# Patient Record
Sex: Female | Born: 1950 | Hispanic: No | Smoking: Never smoker
Health system: Southern US, Community
[De-identification: ages and names within clinical notes are randomized; demographics above are authoritative.]

## PROBLEM LIST (undated history)

## (undated) DIAGNOSIS — D649 Anemia, unspecified: Secondary | ICD-10-CM

## (undated) DIAGNOSIS — M1990 Unspecified osteoarthritis, unspecified site: Secondary | ICD-10-CM

---

## 1998-05-27 ENCOUNTER — Other Ambulatory Visit: Admission: RE | Admit: 1998-05-27 | Discharge: 1998-05-27 | Payer: Self-pay | Admitting: Obstetrics and Gynecology

## 1998-07-04 ENCOUNTER — Other Ambulatory Visit: Admission: RE | Admit: 1998-07-04 | Discharge: 1998-07-04 | Payer: Self-pay | Admitting: Obstetrics and Gynecology

## 1999-07-21 ENCOUNTER — Other Ambulatory Visit: Admission: RE | Admit: 1999-07-21 | Discharge: 1999-07-21 | Payer: Self-pay | Admitting: *Deleted

## 2000-07-29 ENCOUNTER — Other Ambulatory Visit: Admission: RE | Admit: 2000-07-29 | Discharge: 2000-07-29 | Payer: Self-pay | Admitting: Obstetrics and Gynecology

## 2001-08-04 ENCOUNTER — Other Ambulatory Visit: Admission: RE | Admit: 2001-08-04 | Discharge: 2001-08-04 | Payer: Self-pay | Admitting: Obstetrics and Gynecology

## 2002-08-25 ENCOUNTER — Other Ambulatory Visit: Admission: RE | Admit: 2002-08-25 | Discharge: 2002-08-25 | Payer: Self-pay | Admitting: Obstetrics and Gynecology

## 2003-10-26 ENCOUNTER — Other Ambulatory Visit: Admission: RE | Admit: 2003-10-26 | Discharge: 2003-10-26 | Payer: Self-pay | Admitting: Obstetrics and Gynecology

## 2017-02-13 DIAGNOSIS — L821 Other seborrheic keratosis: Secondary | ICD-10-CM | POA: Diagnosis not present

## 2017-02-13 DIAGNOSIS — L814 Other melanin hyperpigmentation: Secondary | ICD-10-CM | POA: Diagnosis not present

## 2017-02-13 DIAGNOSIS — L720 Epidermal cyst: Secondary | ICD-10-CM | POA: Diagnosis not present

## 2017-02-13 DIAGNOSIS — L579 Skin changes due to chronic exposure to nonionizing radiation, unspecified: Secondary | ICD-10-CM | POA: Diagnosis not present

## 2017-04-24 DIAGNOSIS — L720 Epidermal cyst: Secondary | ICD-10-CM | POA: Diagnosis not present

## 2017-04-29 DIAGNOSIS — D492 Neoplasm of unspecified behavior of bone, soft tissue, and skin: Secondary | ICD-10-CM | POA: Diagnosis not present

## 2017-04-29 DIAGNOSIS — L720 Epidermal cyst: Secondary | ICD-10-CM | POA: Diagnosis not present

## 2017-09-06 DIAGNOSIS — H5203 Hypermetropia, bilateral: Secondary | ICD-10-CM | POA: Diagnosis not present

## 2017-09-06 DIAGNOSIS — H52203 Unspecified astigmatism, bilateral: Secondary | ICD-10-CM | POA: Diagnosis not present

## 2017-09-06 DIAGNOSIS — H2513 Age-related nuclear cataract, bilateral: Secondary | ICD-10-CM | POA: Diagnosis not present

## 2017-09-06 DIAGNOSIS — H35363 Drusen (degenerative) of macula, bilateral: Secondary | ICD-10-CM | POA: Diagnosis not present

## 2017-10-02 DIAGNOSIS — B373 Candidiasis of vulva and vagina: Secondary | ICD-10-CM | POA: Diagnosis not present

## 2017-10-02 DIAGNOSIS — F5101 Primary insomnia: Secondary | ICD-10-CM | POA: Diagnosis not present

## 2017-10-02 DIAGNOSIS — Z79899 Other long term (current) drug therapy: Secondary | ICD-10-CM | POA: Diagnosis not present

## 2017-10-02 DIAGNOSIS — M8580 Other specified disorders of bone density and structure, unspecified site: Secondary | ICD-10-CM | POA: Diagnosis not present

## 2017-10-02 DIAGNOSIS — Z0000 Encounter for general adult medical examination without abnormal findings: Secondary | ICD-10-CM | POA: Diagnosis not present

## 2017-10-02 DIAGNOSIS — Z23 Encounter for immunization: Secondary | ICD-10-CM | POA: Diagnosis not present

## 2017-10-22 DIAGNOSIS — M545 Low back pain: Secondary | ICD-10-CM | POA: Diagnosis not present

## 2017-10-28 DIAGNOSIS — M25552 Pain in left hip: Secondary | ICD-10-CM | POA: Diagnosis not present

## 2017-10-28 DIAGNOSIS — M179 Osteoarthritis of knee, unspecified: Secondary | ICD-10-CM | POA: Diagnosis not present

## 2017-10-28 DIAGNOSIS — M1612 Unilateral primary osteoarthritis, left hip: Secondary | ICD-10-CM | POA: Diagnosis not present

## 2017-10-28 DIAGNOSIS — M79605 Pain in left leg: Secondary | ICD-10-CM | POA: Diagnosis not present

## 2018-01-14 DIAGNOSIS — M79605 Pain in left leg: Secondary | ICD-10-CM | POA: Diagnosis not present

## 2018-01-14 DIAGNOSIS — M5136 Other intervertebral disc degeneration, lumbar region: Secondary | ICD-10-CM | POA: Diagnosis not present

## 2018-01-14 DIAGNOSIS — S73192A Other sprain of left hip, initial encounter: Secondary | ICD-10-CM | POA: Diagnosis not present

## 2018-01-14 DIAGNOSIS — M47816 Spondylosis without myelopathy or radiculopathy, lumbar region: Secondary | ICD-10-CM | POA: Diagnosis not present

## 2018-01-14 DIAGNOSIS — M160 Bilateral primary osteoarthritis of hip: Secondary | ICD-10-CM | POA: Diagnosis not present

## 2018-01-14 DIAGNOSIS — S32010S Wedge compression fracture of first lumbar vertebra, sequela: Secondary | ICD-10-CM | POA: Diagnosis not present

## 2018-01-26 DIAGNOSIS — S32010S Wedge compression fracture of first lumbar vertebra, sequela: Secondary | ICD-10-CM | POA: Diagnosis not present

## 2018-01-26 DIAGNOSIS — M25552 Pain in left hip: Secondary | ICD-10-CM | POA: Diagnosis not present

## 2018-01-26 DIAGNOSIS — S73192A Other sprain of left hip, initial encounter: Secondary | ICD-10-CM | POA: Diagnosis not present

## 2018-01-26 DIAGNOSIS — M79605 Pain in left leg: Secondary | ICD-10-CM | POA: Diagnosis not present

## 2018-01-26 DIAGNOSIS — M545 Low back pain: Secondary | ICD-10-CM | POA: Diagnosis not present

## 2018-01-26 DIAGNOSIS — M5136 Other intervertebral disc degeneration, lumbar region: Secondary | ICD-10-CM | POA: Diagnosis not present

## 2018-01-28 DIAGNOSIS — S32010S Wedge compression fracture of first lumbar vertebra, sequela: Secondary | ICD-10-CM | POA: Diagnosis not present

## 2018-03-25 DIAGNOSIS — M4850XD Collapsed vertebra, not elsewhere classified, site unspecified, subsequent encounter for fracture with routine healing: Secondary | ICD-10-CM | POA: Diagnosis not present

## 2018-03-25 DIAGNOSIS — M545 Low back pain: Secondary | ICD-10-CM | POA: Diagnosis not present

## 2018-03-25 DIAGNOSIS — M2560 Stiffness of unspecified joint, not elsewhere classified: Secondary | ICD-10-CM | POA: Diagnosis not present

## 2018-04-09 DIAGNOSIS — M545 Low back pain: Secondary | ICD-10-CM | POA: Diagnosis not present

## 2018-04-16 DIAGNOSIS — M2560 Stiffness of unspecified joint, not elsewhere classified: Secondary | ICD-10-CM | POA: Diagnosis not present

## 2018-04-16 DIAGNOSIS — M545 Low back pain: Secondary | ICD-10-CM | POA: Diagnosis not present

## 2018-04-18 DIAGNOSIS — M4850XD Collapsed vertebra, not elsewhere classified, site unspecified, subsequent encounter for fracture with routine healing: Secondary | ICD-10-CM | POA: Diagnosis not present

## 2018-04-22 DIAGNOSIS — M545 Low back pain: Secondary | ICD-10-CM | POA: Diagnosis not present

## 2018-04-22 DIAGNOSIS — M2560 Stiffness of unspecified joint, not elsewhere classified: Secondary | ICD-10-CM | POA: Diagnosis not present

## 2018-04-25 DIAGNOSIS — M4850XD Collapsed vertebra, not elsewhere classified, site unspecified, subsequent encounter for fracture with routine healing: Secondary | ICD-10-CM | POA: Diagnosis not present

## 2018-04-29 DIAGNOSIS — M4856XD Collapsed vertebra, not elsewhere classified, lumbar region, subsequent encounter for fracture with routine healing: Secondary | ICD-10-CM | POA: Diagnosis not present

## 2018-05-02 DIAGNOSIS — M545 Low back pain: Secondary | ICD-10-CM | POA: Diagnosis not present

## 2018-05-05 DIAGNOSIS — M4850XA Collapsed vertebra, not elsewhere classified, site unspecified, initial encounter for fracture: Secondary | ICD-10-CM | POA: Diagnosis not present

## 2018-05-05 DIAGNOSIS — M2560 Stiffness of unspecified joint, not elsewhere classified: Secondary | ICD-10-CM | POA: Diagnosis not present

## 2018-05-05 DIAGNOSIS — S73192D Other sprain of left hip, subsequent encounter: Secondary | ICD-10-CM | POA: Diagnosis not present

## 2018-08-08 DIAGNOSIS — M7062 Trochanteric bursitis, left hip: Secondary | ICD-10-CM | POA: Diagnosis not present

## 2018-09-20 DIAGNOSIS — R05 Cough: Secondary | ICD-10-CM | POA: Diagnosis not present

## 2018-09-20 DIAGNOSIS — J018 Other acute sinusitis: Secondary | ICD-10-CM | POA: Diagnosis not present

## 2018-09-26 DIAGNOSIS — H35363 Drusen (degenerative) of macula, bilateral: Secondary | ICD-10-CM | POA: Diagnosis not present

## 2018-09-26 DIAGNOSIS — H04123 Dry eye syndrome of bilateral lacrimal glands: Secondary | ICD-10-CM | POA: Diagnosis not present

## 2018-09-26 DIAGNOSIS — D3142 Benign neoplasm of left ciliary body: Secondary | ICD-10-CM | POA: Diagnosis not present

## 2018-09-26 DIAGNOSIS — H52203 Unspecified astigmatism, bilateral: Secondary | ICD-10-CM | POA: Diagnosis not present

## 2018-09-26 DIAGNOSIS — H5203 Hypermetropia, bilateral: Secondary | ICD-10-CM | POA: Diagnosis not present

## 2018-09-26 DIAGNOSIS — D3141 Benign neoplasm of right ciliary body: Secondary | ICD-10-CM | POA: Diagnosis not present

## 2018-09-26 DIAGNOSIS — H524 Presbyopia: Secondary | ICD-10-CM | POA: Diagnosis not present

## 2018-09-26 DIAGNOSIS — H2513 Age-related nuclear cataract, bilateral: Secondary | ICD-10-CM | POA: Diagnosis not present

## 2018-10-07 DIAGNOSIS — M810 Age-related osteoporosis without current pathological fracture: Secondary | ICD-10-CM | POA: Diagnosis not present

## 2018-10-07 DIAGNOSIS — M549 Dorsalgia, unspecified: Secondary | ICD-10-CM | POA: Diagnosis not present

## 2018-10-07 DIAGNOSIS — I444 Left anterior fascicular block: Secondary | ICD-10-CM | POA: Diagnosis not present

## 2018-10-07 DIAGNOSIS — B373 Candidiasis of vulva and vagina: Secondary | ICD-10-CM | POA: Diagnosis not present

## 2018-10-07 DIAGNOSIS — Z0000 Encounter for general adult medical examination without abnormal findings: Secondary | ICD-10-CM | POA: Diagnosis not present

## 2018-10-07 DIAGNOSIS — R05 Cough: Secondary | ICD-10-CM | POA: Diagnosis not present

## 2018-10-07 DIAGNOSIS — I959 Hypotension, unspecified: Secondary | ICD-10-CM | POA: Diagnosis not present

## 2018-10-07 DIAGNOSIS — F5101 Primary insomnia: Secondary | ICD-10-CM | POA: Diagnosis not present

## 2018-10-15 DIAGNOSIS — M545 Low back pain: Secondary | ICD-10-CM | POA: Diagnosis not present

## 2018-10-17 DIAGNOSIS — I959 Hypotension, unspecified: Secondary | ICD-10-CM | POA: Diagnosis not present

## 2018-10-17 DIAGNOSIS — R8290 Unspecified abnormal findings in urine: Secondary | ICD-10-CM | POA: Diagnosis not present

## 2018-10-17 DIAGNOSIS — R05 Cough: Secondary | ICD-10-CM | POA: Diagnosis not present

## 2018-10-17 DIAGNOSIS — Z23 Encounter for immunization: Secondary | ICD-10-CM | POA: Diagnosis not present

## 2019-01-21 DIAGNOSIS — M7062 Trochanteric bursitis, left hip: Secondary | ICD-10-CM | POA: Diagnosis not present

## 2019-02-18 DIAGNOSIS — M546 Pain in thoracic spine: Secondary | ICD-10-CM | POA: Diagnosis not present

## 2019-03-01 DIAGNOSIS — M545 Low back pain: Secondary | ICD-10-CM | POA: Diagnosis not present

## 2019-03-01 DIAGNOSIS — S22050A Wedge compression fracture of T5-T6 vertebra, initial encounter for closed fracture: Secondary | ICD-10-CM | POA: Diagnosis not present

## 2019-03-01 DIAGNOSIS — S22030A Wedge compression fracture of third thoracic vertebra, initial encounter for closed fracture: Secondary | ICD-10-CM | POA: Diagnosis not present

## 2019-03-01 DIAGNOSIS — S32010A Wedge compression fracture of first lumbar vertebra, initial encounter for closed fracture: Secondary | ICD-10-CM | POA: Diagnosis not present

## 2019-03-01 DIAGNOSIS — S22080A Wedge compression fracture of T11-T12 vertebra, initial encounter for closed fracture: Secondary | ICD-10-CM | POA: Diagnosis not present

## 2019-03-01 DIAGNOSIS — S32020A Wedge compression fracture of second lumbar vertebra, initial encounter for closed fracture: Secondary | ICD-10-CM | POA: Diagnosis not present

## 2019-03-01 DIAGNOSIS — M546 Pain in thoracic spine: Secondary | ICD-10-CM | POA: Diagnosis not present

## 2019-03-04 DIAGNOSIS — M545 Low back pain: Secondary | ICD-10-CM | POA: Diagnosis not present

## 2019-03-04 DIAGNOSIS — M546 Pain in thoracic spine: Secondary | ICD-10-CM | POA: Diagnosis not present

## 2019-03-06 DIAGNOSIS — M810 Age-related osteoporosis without current pathological fracture: Secondary | ICD-10-CM | POA: Diagnosis not present

## 2019-08-17 DIAGNOSIS — M4696 Unspecified inflammatory spondylopathy, lumbar region: Secondary | ICD-10-CM | POA: Diagnosis not present

## 2019-08-17 DIAGNOSIS — M545 Low back pain: Secondary | ICD-10-CM | POA: Diagnosis not present

## 2019-08-21 DIAGNOSIS — M47816 Spondylosis without myelopathy or radiculopathy, lumbar region: Secondary | ICD-10-CM | POA: Diagnosis not present

## 2019-09-08 DIAGNOSIS — M47816 Spondylosis without myelopathy or radiculopathy, lumbar region: Secondary | ICD-10-CM | POA: Diagnosis not present

## 2019-09-30 DIAGNOSIS — M47816 Spondylosis without myelopathy or radiculopathy, lumbar region: Secondary | ICD-10-CM | POA: Diagnosis not present

## 2019-10-09 DIAGNOSIS — H2513 Age-related nuclear cataract, bilateral: Secondary | ICD-10-CM | POA: Diagnosis not present

## 2019-10-09 DIAGNOSIS — H52203 Unspecified astigmatism, bilateral: Secondary | ICD-10-CM | POA: Diagnosis not present

## 2019-10-09 DIAGNOSIS — D3142 Benign neoplasm of left ciliary body: Secondary | ICD-10-CM | POA: Diagnosis not present

## 2019-10-09 DIAGNOSIS — H5203 Hypermetropia, bilateral: Secondary | ICD-10-CM | POA: Diagnosis not present

## 2019-10-09 DIAGNOSIS — H04123 Dry eye syndrome of bilateral lacrimal glands: Secondary | ICD-10-CM | POA: Diagnosis not present

## 2019-10-09 DIAGNOSIS — H524 Presbyopia: Secondary | ICD-10-CM | POA: Diagnosis not present

## 2019-10-09 DIAGNOSIS — D3141 Benign neoplasm of right ciliary body: Secondary | ICD-10-CM | POA: Diagnosis not present

## 2019-10-09 DIAGNOSIS — H35363 Drusen (degenerative) of macula, bilateral: Secondary | ICD-10-CM | POA: Diagnosis not present

## 2019-10-14 DIAGNOSIS — M159 Polyosteoarthritis, unspecified: Secondary | ICD-10-CM | POA: Diagnosis not present

## 2019-10-14 DIAGNOSIS — R8290 Unspecified abnormal findings in urine: Secondary | ICD-10-CM | POA: Diagnosis not present

## 2019-10-14 DIAGNOSIS — Z79899 Other long term (current) drug therapy: Secondary | ICD-10-CM | POA: Diagnosis not present

## 2019-10-14 DIAGNOSIS — F5101 Primary insomnia: Secondary | ICD-10-CM | POA: Diagnosis not present

## 2019-10-14 DIAGNOSIS — M810 Age-related osteoporosis without current pathological fracture: Secondary | ICD-10-CM | POA: Diagnosis not present

## 2019-10-14 DIAGNOSIS — Z0000 Encounter for general adult medical examination without abnormal findings: Secondary | ICD-10-CM | POA: Diagnosis not present

## 2019-10-23 DIAGNOSIS — M546 Pain in thoracic spine: Secondary | ICD-10-CM | POA: Diagnosis not present

## 2019-11-02 DIAGNOSIS — M546 Pain in thoracic spine: Secondary | ICD-10-CM | POA: Diagnosis not present

## 2019-11-06 ENCOUNTER — Other Ambulatory Visit: Payer: Self-pay | Admitting: Orthopedic Surgery

## 2019-11-06 DIAGNOSIS — M545 Low back pain: Secondary | ICD-10-CM | POA: Diagnosis not present

## 2019-11-09 NOTE — Progress Notes (Signed)
[id number] - [place], [place] - [address], [address] AT [facility] [address] [place], [address] [place] [address] Phone: [phone] Fax: [phone]      Your procedure is scheduled on Thursday, November 12, 2019.  Report to [facility] Main Entrance "A" at 10:00 A.M., and check in at the Admitting office.  Call this number if you have problems the morning of surgery:  [phone]  Call [phone] if you have any questions prior to your surgery date Monday-Friday 8am-4pm    Remember:  Do not eat after midnight the night before your surgery  You may drink clear liquids until 9:00 AM the morning of your surgery.   Clear liquids allowed are: Water, Non-Citrus Juices (without pulp), Carbonated Beverages, Clear Tea, Black Coffee Only, and Gatorade  Please complete your PRE-SURGERY ENSURE that was provided to you by 9:00 AM the morning of surgery.  Please, if able, drink it in one setting. DO NOT SIP.     Take these medicines the morning of surgery with A SIP OF WATER: traMADol (ULTRAM)  7 days prior to surgery STOP taking any Aspirin (unless otherwise instructed by your surgeon), Aleve, Naproxen, Ibuprofen, Motrin, Advil, Goody's, BC's, all herbal medications, fish oil, and all vitamins.    The Morning of Surgery  Do not wear jewelry, make-up or nail polish.  Do not wear lotions, powders, perfumes, or deodorant  Do not shave 48 hours prior to surgery.  Do not bring valuables to the hospital.  [facility] is not responsible for any belongings or valuables.  If you are a smoker, DO NOT Smoke 24 hours prior to surgery  If you wear a CPAP at night please bring your mask the morning of surgery   Remember that you must have someone to transport you home after your surgery, and remain with you for 24 hours if you are discharged the same day.   Please bring cases for contacts, glasses, hearing aids, dentures or bridgework because it  cannot be worn into surgery.    Leave your suitcase in the car.  After surgery it may be brought to your room.  For patients admitted to the hospital, discharge time will be determined by your treatment team.  Patients discharged the day of surgery will not be allowed to drive home.    Special instructions:   [facility]- Preparing For Surgery  Before surgery, you can play an important role. Because skin is not sterile, your skin needs to be as free of germs as possible. You can reduce the number of germs on your skin by washing with CHG (chlorahexidine gluconate) Soap before surgery.  CHG is an antiseptic cleaner which kills germs and bonds with the skin to continue killing germs even after washing.    Oral Hygiene is also important to reduce your risk of infection.  Remember - BRUSH YOUR TEETH THE MORNING OF SURGERY WITH YOUR REGULAR TOOTHPASTE  Please do not use if you have an allergy to CHG or antibacterial soaps. If your skin becomes reddened/irritated stop using the CHG.  Do not shave (including legs and underarms) for at least 48 hours prior to first CHG shower. It is OK to shave your face.  Please follow these instructions carefully.   1. Shower the NIGHT BEFORE SURGERY and the MORNING OF SURGERY with CHG Soap.   2. If you chose to wash your hair, wash your hair first as usual with your normal shampoo.  3.  After you shampoo, rinse your hair and body thoroughly to remove the shampoo.  4. Use CHG as you would any other liquid soap. You can apply CHG directly to the skin and wash gently with a scrungie or a clean washcloth.   5. Apply the CHG Soap to your body ONLY FROM THE NECK DOWN.  Do not use on open wounds or open sores. Avoid contact with your eyes, ears, mouth and genitals (private parts). Wash Face and genitals (private parts)  with your normal soap.   6. Wash thoroughly, paying special attention to the area where your surgery will be performed.  7. Thoroughly rinse  your body with warm water from the neck down.  8. DO NOT shower/wash with your normal soap after using and rinsing off the CHG Soap.  9. Pat yourself dry with a CLEAN TOWEL.  10. Wear CLEAN PAJAMAS to bed the night before surgery, wear comfortable clothes the morning of surgery  11. Place CLEAN SHEETS on your bed the night of your first shower and DO NOT SLEEP WITH PETS.    Day of Surgery:  Please shower the morning of surgery with the CHG soap Do not apply any deodorants/lotions. Please wear clean clothes to the hospital/surgery center.   Remember to brush your teeth WITH YOUR REGULAR TOOTHPASTE.   Please read over the following fact sheets that you were given.

## 2019-11-10 ENCOUNTER — Other Ambulatory Visit: Payer: Self-pay

## 2019-11-10 ENCOUNTER — Other Ambulatory Visit (HOSPITAL_COMMUNITY)
Admission: RE | Admit: 2019-11-10 | Discharge: 2019-11-10 | Disposition: A | Discharge status: Home / Self Care | Payer: PPO | Source: Ambulatory Visit | Attending: Orthopedic Surgery | Admitting: Orthopedic Surgery

## 2019-11-10 ENCOUNTER — Encounter (HOSPITAL_COMMUNITY)
Admission: RE | Admit: 2019-11-10 | Discharge: 2019-11-10 | Disposition: A | Discharge status: Home / Self Care | Payer: PPO | Source: Ambulatory Visit | Attending: Orthopedic Surgery | Admitting: Orthopedic Surgery

## 2019-11-10 ENCOUNTER — Encounter (HOSPITAL_COMMUNITY): Payer: Self-pay

## 2019-11-10 DIAGNOSIS — Z79899 Other long term (current) drug therapy: Secondary | ICD-10-CM | POA: Diagnosis not present

## 2019-11-10 DIAGNOSIS — Z01810 Encounter for preprocedural cardiovascular examination: Secondary | ICD-10-CM | POA: Diagnosis not present

## 2019-11-10 DIAGNOSIS — M1990 Unspecified osteoarthritis, unspecified site: Secondary | ICD-10-CM | POA: Diagnosis not present

## 2019-11-10 DIAGNOSIS — M4854XA Collapsed vertebra, not elsewhere classified, thoracic region, initial encounter for fracture: Secondary | ICD-10-CM | POA: Diagnosis not present

## 2019-11-10 DIAGNOSIS — Z01818 Encounter for other preprocedural examination: Secondary | ICD-10-CM | POA: Insufficient documentation

## 2019-11-10 DIAGNOSIS — Z20822 Contact with and (suspected) exposure to covid-19: Secondary | ICD-10-CM | POA: Diagnosis not present

## 2019-11-10 DIAGNOSIS — D649 Anemia, unspecified: Secondary | ICD-10-CM | POA: Insufficient documentation

## 2019-11-10 DIAGNOSIS — Z01812 Encounter for preprocedural laboratory examination: Secondary | ICD-10-CM | POA: Diagnosis not present

## 2019-11-10 HISTORY — DX: Unspecified osteoarthritis, unspecified site: M19.90

## 2019-11-10 HISTORY — DX: Anemia, unspecified: D64.9

## 2019-11-10 LAB — CBC WITH DIFFERENTIAL/PLATELET
Abs Immature Granulocytes: 0.03 10*3/uL (ref 0.00–0.07)
Basophils Absolute: 0 10*3/uL (ref 0.0–0.1)
Basophils Relative: 0 %
Eosinophils Absolute: 0 10*3/uL (ref 0.0–0.5)
Eosinophils Relative: 0 %
HCT: 38 % (ref 36.0–46.0)
Hemoglobin: 12.3 g/dL (ref 12.0–15.0)
Immature Granulocytes: 1 %
Lymphocytes Relative: 33 %
Lymphs Abs: 1.5 10*3/uL (ref 0.7–4.0)
MCH: 33.3 pg (ref 26.0–34.0)
MCHC: 32.4 g/dL (ref 30.0–36.0)
MCV: 103 fL — ABNORMAL HIGH (ref 80.0–100.0)
Monocytes Absolute: 0.4 10*3/uL (ref 0.1–1.0)
Monocytes Relative: 9 %
Neutro Abs: 2.6 10*3/uL (ref 1.7–7.7)
Neutrophils Relative %: 57 %
Platelets: 266 10*3/uL (ref 150–400)
RBC: 3.69 MIL/uL — ABNORMAL LOW (ref 3.87–5.11)
RDW: 14 % (ref 11.5–15.5)
WBC: 4.6 10*3/uL (ref 4.0–10.5)
nRBC: 0 % (ref 0.0–0.2)

## 2019-11-10 LAB — URINALYSIS, ROUTINE W REFLEX MICROSCOPIC
Bilirubin Urine: NEGATIVE
Glucose, UA: NEGATIVE mg/dL
Hgb urine dipstick: NEGATIVE
Ketones, ur: NEGATIVE mg/dL
Leukocytes,Ua: NEGATIVE
Nitrite: NEGATIVE
Protein, ur: NEGATIVE mg/dL
Specific Gravity, Urine: 1.021 (ref 1.005–1.030)
pH: 6 (ref 5.0–8.0)

## 2019-11-10 LAB — COMPREHENSIVE METABOLIC PANEL
ALT: 17 U/L (ref 0–44)
AST: 23 U/L (ref 15–41)
Albumin: 4.2 g/dL (ref 3.5–5.0)
Alkaline Phosphatase: 82 U/L (ref 38–126)
Anion gap: 10 (ref 5–15)
BUN: 15 mg/dL (ref 8–23)
CO2: 28 mmol/L (ref 22–32)
Calcium: 10.4 mg/dL — ABNORMAL HIGH (ref 8.9–10.3)
Chloride: 102 mmol/L (ref 98–111)
Creatinine, Ser: 0.91 mg/dL (ref 0.44–1.00)
GFR calc Af Amer: >60 mL/min (ref >60)
GFR calc non Af Amer: >60 mL/min (ref >60)
Glucose, Bld: 96 mg/dL (ref 70–99)
Potassium: 4 mmol/L (ref 3.5–5.1)
Sodium: 140 mmol/L (ref 135–145)
Total Bilirubin: 0.3 mg/dL (ref 0.3–1.2)
Total Protein: 6.6 g/dL (ref 6.5–8.1)

## 2019-11-10 LAB — TYPE AND SCREEN
ABO/RH(D): A POS
Antibody Screen: NEGATIVE

## 2019-11-10 LAB — PROTIME-INR
INR: 0.9 (ref 0.8–1.2)
Prothrombin Time: 12.5 seconds (ref 11.4–15.2)

## 2019-11-10 LAB — SURGICAL PCR SCREEN
MRSA, PCR: NEGATIVE
Staphylococcus aureus: NEGATIVE

## 2019-11-10 LAB — ABO/RH: ABO/RH(D): A POS

## 2019-11-10 LAB — SARS CORONAVIRUS 2 (TAT 6-24 HRS): SARS Coronavirus 2: NEGATIVE

## 2019-11-10 LAB — APTT: aPTT: 24 seconds (ref 24–36)

## 2019-11-10 NOTE — Progress Notes (Signed)
PCP - Dr. Ward Givens Cardiologist - Denies  PPM/ICD - Denies  Chest x-ray - N/A EKG -  11/10/19 Stress Test - Denies ECHO - Denies Cardiac Cath - Denies  Sleep Study - Denies  Pt denies being diabetic.   Blood Thinner Instructions: N/A Aspirin Instructions: N/A  ERAS Protcol - Yes, PRE-SURGERY Ensure  COVID TEST- 11/10/19   Coronavirus Screening  Have you experienced the following symptoms:  Cough yes/no: No Fever (>100.22F)  yes/no: No Runny nose yes/no: No Sore throat yes/no: No Difficulty breathing/shortness of breath  yes/no: No  Have you or a family member traveled in the last 14 days and where? yes/no: No   If the patient indicates "YES" to the above questions, their PAT will be rescheduled to limit the exposure to others and, the surgeon will be notified. THE PATIENT WILL NEED TO BE ASYMPTOMATIC FOR 14 DAYS.   If the patient is not experiencing any of these symptoms, the PAT nurse will instruct them to NOT bring anyone with them to their appointment since they may have these symptoms or traveled as well.   Please remind your patients and families that hospital visitation restrictions are in effect and the importance of the restrictions.     Anesthesia review: Yes, abnormal EKG  Patient denies shortness of breath, fever, cough and chest pain at PAT appointment   All instructions explained to the patient, with a verbal understanding of the material. Patient agrees to go over the instructions while at home for a better understanding. Patient also instructed to self quarantine after being tested for COVID-19. The opportunity to ask questions was provided.

## 2019-11-11 NOTE — Anesthesia Preprocedure Evaluation (Addendum)
Anesthesia Evaluation  Patient identified by MRN, date of birth, ID band Patient awake    Reviewed: Allergy & Precautions, H&P , NPO status , Patient's Chart, lab work & pertinent test results  Airway Mallampati: II  TM Distance: >3 FB Neck ROM: Full    Dental no notable dental hx. (+) Teeth Intact, Dental Advisory Given   Pulmonary neg pulmonary ROS,    Pulmonary exam normal breath sounds clear to auscultation       Cardiovascular negative cardio ROS   Rhythm:Regular Rate:Normal     Neuro/Psych negative neurological ROS  negative psych ROS   GI/Hepatic negative GI ROS, Neg liver ROS,   Endo/Other  negative endocrine ROS  Renal/GU negative Renal ROS  negative genitourinary   Musculoskeletal  (+) Arthritis , Osteoarthritis,    Abdominal   Peds  Hematology  (+) Blood dyscrasia, anemia ,   Anesthesia Other Findings   Reproductive/Obstetrics negative OB ROS                            Anesthesia Physical Anesthesia Plan  ASA: II  Anesthesia Plan: General   Post-op Pain Management:    Induction: Intravenous  PONV Risk Score and Plan: 4 or greater and Ondansetron, Dexamethasone and Midazolam  Airway Management Planned: Oral ETT  Additional Equipment:   Intra-op Plan:   Post-operative Plan: Extubation in OR  Informed Consent: I have reviewed the patients History and Physical, chart, labs and discussed the procedure including the risks, benefits and alternatives for the proposed anesthesia with the patient or authorized representative who has indicated his/her understanding and acceptance.     Dental advisory given  Plan Discussed with: CRNA  Anesthesia Plan Comments: (PAT note written 11/11/2019 by Myra Gianotti, PA-C. )       Anesthesia Quick Evaluation

## 2019-11-11 NOTE — Progress Notes (Signed)
Anesthesia Chart Review:  Case: [id number] Date/Time: 11/12/19 1159   Procedure: THORACIC 8 AND THORACIC 11 KYPHOPLASTY (N/A )   Anesthesia type: General   Pre-op diagnosis: THORACIC COMPRESSION FRACTURES   Location: [place] / [place]   Surgeons: Phylliss Bob, MD      DISCUSSION: Patient is a 69 year old female scheduled for the above procedure.  History includes never smoker, anemia, arthritis.   Preoperative EKG showed SR, LAFB, negative T wave in V1-2. Comparison EKGs requested from her PCP, but based on EKG descriptions found in Care Everywhere the findings appear stable. No cardiac testing recommended when she was evaluated in 2017. She denied chest pain and SOB at PAT RN visit.   Preoperative COVID-19 test on 11/10/19 was negative. Based on currently available information, I would anticipate that she can proceed as planned if no acute changes.   VS: BP 111/65   Pulse 75   Temp 36.9 C (Oral)   Resp 18   Ht 5\' 3"  (1.6 m)   Wt 53.2 kg   SpO2 98%   BMI 20.76 kg/m     PROVIDERS: Spry, Marsh Dolly., MD is PCP ([place]). Wellness exam 10/14/19.  - She is not routinely followed by cardiology but saw Clarene Critchley, MD on 10/25/15 ([place]) for atypical chest pain (noticed days after starting workout with her arms, but no exertional symptoms) and "mild T-wave abnormalities" (tracing requested, but described as "flipped T waves in the anterior leads" by Dr. Harrell Lark who referred her to cardiology). Chest pain felt musculoskeletal in nature and had completely resolved by cardiology visit, and EKG abnormalities felt to be a "normal variant". As needed cardiology follow-up recommended.   LABS: Labs reviewed: Acceptable for surgery. (all labs ordered are listed, but only abnormal results are displayed)  Labs Reviewed  CBC WITH DIFFERENTIAL/PLATELET - Abnormal; Notable for the following components:      Result Value   RBC 3.69 (*)    MCV 103.0 (*)    All other  components within normal limits  COMPREHENSIVE METABOLIC PANEL - Abnormal; Notable for the following components:   Calcium 10.4 (*)    All other components within normal limits  SURGICAL PCR SCREEN  APTT  PROTIME-INR  URINALYSIS, ROUTINE W REFLEX MICROSCOPIC  TYPE AND SCREEN  ABO/RH     IMAGES: MRI T-spine 11/03/19 [facility]): IMPRESSION: 1. Acute or subacute T8 and T11 compression fractures with mild height loss and no cord impingement. 2. Multiple remote and healed compression fractures. 3. 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   EKG: 11/10/19:  Normal sinus rhythm Left anterior fascicular block Abnormal ECG No old tracing to compare Confirmed by Adrian Prows ([id number]) on 11/10/2019 7:44:57 PM - No old tracing through [facility]. Tracing also shows negative T waves in V1-2. She does have a 10/07/18 and 09/20/15 EKG done through her PCP. Tracings requested, but according to documentation in [place], those tracings showed: 10/07/18: "EKG: NSR with left ant. Fasc. Block and non-specific T wave changes" 09/20/15: "flipped T waves in the anterior leads" (She was referred to cardiology as discussed above--no testing ordered) - Based on comparison EKG descriptions, it sounds like 11/10/19 EKG findings are stable.        CV: N/A   Past Medical History:  Diagnosis Date  . Anemia   . Arthritis     Past Surgical History:  Procedure Laterality Date  . TUBAL LIGATION      MEDICATIONS: .  acidophilus (RISAQUAD) CAPS capsule  . Calcium Carbonate-Vit D-Min (CALCIUM 1200 PO)  . Flaxseed, Linseed, (FLAXSEED OIL PO)  . magnesium oxide (MAG-OX) 400 MG tablet  . Multiple Vitamin (MULTIVITAMIN WITH MINERALS) TABS tablet  . Omega-3 Fatty Acids (FISH OIL) 1000 MG CAPS  . PREBIOTIC PRODUCT PO  . traMADol (ULTRAM) 50 MG tablet  . traZODone (DESYREL) 150 MG tablet  . vitamin B-12 (CYANOCOBALAMIN) 500 MCG tablet  . vitamin C (ASCORBIC ACID) 250 MG tablet  . Vitamin  D-Vitamin K (K2 PLUS D3 PO)  . vitamin E 180 MG (400 UNITS) capsule  . zinc gluconate 50 MG tablet   No current facility-administered medications for this encounter.     Myra Gianotti, PA-C Surgical Short Stay/Anesthesiology [facility] Phone [phone] [facility] [place] Phone [phone] 11/11/2019 9:27 AM

## 2019-11-12 ENCOUNTER — Ambulatory Visit (HOSPITAL_COMMUNITY): Payer: PPO | Admitting: Vascular Surgery

## 2019-11-12 ENCOUNTER — Ambulatory Visit (HOSPITAL_COMMUNITY)
Admission: RE | Admit: 2019-11-12 | Discharge: 2019-11-12 | Disposition: A | Discharge status: Home / Self Care | Payer: PPO | Attending: Orthopedic Surgery | Admitting: Orthopedic Surgery

## 2019-11-12 ENCOUNTER — Encounter (HOSPITAL_COMMUNITY)
Admission: RE | Disposition: A | Discharge status: Home / Self Care | Payer: Self-pay | Source: Home / Self Care | Attending: Orthopedic Surgery

## 2019-11-12 ENCOUNTER — Encounter (HOSPITAL_COMMUNITY): Payer: Self-pay | Admitting: Orthopedic Surgery

## 2019-11-12 ENCOUNTER — Other Ambulatory Visit: Payer: Self-pay

## 2019-11-12 ENCOUNTER — Ambulatory Visit (HOSPITAL_COMMUNITY): Payer: PPO

## 2019-11-12 ENCOUNTER — Ambulatory Visit (HOSPITAL_COMMUNITY): Payer: PPO | Admitting: Certified Registered Nurse Anesthetist

## 2019-11-12 DIAGNOSIS — Z981 Arthrodesis status: Secondary | ICD-10-CM | POA: Diagnosis not present

## 2019-11-12 DIAGNOSIS — D649 Anemia, unspecified: Secondary | ICD-10-CM | POA: Diagnosis not present

## 2019-11-12 DIAGNOSIS — S22068A Other fracture of T7-T8 thoracic vertebra, initial encounter for closed fracture: Secondary | ICD-10-CM | POA: Diagnosis not present

## 2019-11-12 DIAGNOSIS — M1990 Unspecified osteoarthritis, unspecified site: Secondary | ICD-10-CM | POA: Diagnosis not present

## 2019-11-12 DIAGNOSIS — M4854XA Collapsed vertebra, not elsewhere classified, thoracic region, initial encounter for fracture: Secondary | ICD-10-CM | POA: Insufficient documentation

## 2019-11-12 DIAGNOSIS — S22088A Other fracture of T11-T12 vertebra, initial encounter for closed fracture: Secondary | ICD-10-CM | POA: Diagnosis not present

## 2019-11-12 DIAGNOSIS — Z79899 Other long term (current) drug therapy: Secondary | ICD-10-CM | POA: Diagnosis not present

## 2019-11-12 DIAGNOSIS — Z419 Encounter for procedure for purposes other than remedying health state, unspecified: Secondary | ICD-10-CM

## 2019-11-12 HISTORY — PX: KYPHOPLASTY: SHX5884

## 2019-11-12 SURGERY — KYPHOPLASTY
Anesthesia: General | Site: Spine Thoracic

## 2019-11-12 MED ORDER — BACITRACIN 500 UNIT/GM EX OINT
TOPICAL_OINTMENT | CUTANEOUS | Status: DC | PRN
  Administered 2019-11-12: 1 via TOPICAL

## 2019-11-12 MED ORDER — LIDOCAINE 2% (20 MG/ML) 5 ML SYRINGE
INTRAMUSCULAR | Status: DC | PRN
  Administered 2019-11-12: 20 mg via INTRAVENOUS

## 2019-11-12 MED ORDER — FENTANYL CITRATE (PF) 250 MCG/5ML IJ SOLN
INTRAMUSCULAR | Status: AC
  Filled 2019-11-12: qty 5

## 2019-11-12 MED ORDER — ONDANSETRON HCL 4 MG/2ML IJ SOLN
INTRAMUSCULAR | Status: AC
  Filled 2019-11-12: qty 2

## 2019-11-12 MED ORDER — FENTANYL CITRATE (PF) 250 MCG/5ML IJ SOLN
INTRAMUSCULAR | Status: DC | PRN
  Administered 2019-11-12: 25 ug via INTRAVENOUS
  Administered 2019-11-12 (×2): 50 ug via INTRAVENOUS

## 2019-11-12 MED ORDER — ACETAMINOPHEN 500 MG PO TABS
ORAL_TABLET | ORAL | Status: AC
  Administered 2019-11-12: 1000 mg via ORAL
  Filled 2019-11-12: qty 2

## 2019-11-12 MED ORDER — SUGAMMADEX SODIUM 200 MG/2ML IV SOLN
INTRAVENOUS | Status: DC | PRN
  Administered 2019-11-12: 130 mg via INTRAVENOUS

## 2019-11-12 MED ORDER — IOPAMIDOL (ISOVUE-300) INJECTION 61%
INTRAVENOUS | Status: DC | PRN
  Administered 2019-11-12 (×2): 50 mL

## 2019-11-12 MED ORDER — PROPOFOL 10 MG/ML IV BOLUS
INTRAVENOUS | Status: DC | PRN
  Administered 2019-11-12: 70 mg via INTRAVENOUS

## 2019-11-12 MED ORDER — LIDOCAINE HCL 4 % EX SOLN
CUTANEOUS | Status: DC | PRN
  Administered 2019-11-12: 4 mL via TOPICAL

## 2019-11-12 MED ORDER — MIDAZOLAM HCL 2 MG/2ML IJ SOLN
INTRAMUSCULAR | Status: AC
  Filled 2019-11-12: qty 2

## 2019-11-12 MED ORDER — ACETAMINOPHEN 500 MG PO TABS: 1000.0000 mg | ORAL_TABLET | Freq: Once | ORAL | Status: AC

## 2019-11-12 MED ORDER — HYDROCODONE-ACETAMINOPHEN 5-325 MG PO TABS: 1.0000 | ORAL_TABLET | Freq: Four times a day (QID) | ORAL | 0 refills | Status: AC | PRN

## 2019-11-12 MED ORDER — DEXAMETHASONE SODIUM PHOSPHATE 10 MG/ML IJ SOLN
INTRAMUSCULAR | Status: DC | PRN
  Administered 2019-11-12: 4 mg via INTRAVENOUS

## 2019-11-12 MED ORDER — LACTATED RINGERS IV SOLN: INTRAVENOUS | Status: DC

## 2019-11-12 MED ORDER — CEFAZOLIN SODIUM-DEXTROSE 2-4 GM/100ML-% IV SOLN
INTRAVENOUS | Status: AC
  Filled 2019-11-12: qty 100

## 2019-11-12 MED ORDER — PROPOFOL 10 MG/ML IV BOLUS
INTRAVENOUS | Status: AC
  Filled 2019-11-12: qty 20

## 2019-11-12 MED ORDER — 0.9 % SODIUM CHLORIDE (POUR BTL) OPTIME
TOPICAL | Status: DC | PRN
  Administered 2019-11-12 (×3): 1000 mL

## 2019-11-12 MED ORDER — POVIDONE-IODINE 7.5 % EX SOLN
Freq: Once | CUTANEOUS | Status: DC
  Filled 2019-11-12: qty 118

## 2019-11-12 MED ORDER — CEFAZOLIN SODIUM-DEXTROSE 2-4 GM/100ML-% IV SOLN
2.0000 g | INTRAVENOUS | Status: AC
  Administered 2019-11-12: 2 g via INTRAVENOUS

## 2019-11-12 MED ORDER — DEXAMETHASONE SODIUM PHOSPHATE 10 MG/ML IJ SOLN
INTRAMUSCULAR | Status: AC
  Filled 2019-11-12: qty 1

## 2019-11-12 MED ORDER — ROCURONIUM BROMIDE 10 MG/ML (PF) SYRINGE
PREFILLED_SYRINGE | INTRAVENOUS | Status: DC | PRN
  Administered 2019-11-12: 30 mg via INTRAVENOUS

## 2019-11-12 MED ORDER — EPHEDRINE SULFATE-NACL 50-0.9 MG/10ML-% IV SOSY
PREFILLED_SYRINGE | INTRAVENOUS | Status: DC | PRN
  Administered 2019-11-12 (×4): 5 mg via INTRAVENOUS
  Administered 2019-11-12 (×2): 10 mg via INTRAVENOUS
  Administered 2019-11-12 (×2): 5 mg via INTRAVENOUS

## 2019-11-12 MED ORDER — BACITRACIN ZINC 500 UNIT/GM EX OINT
TOPICAL_OINTMENT | CUTANEOUS | Status: AC
  Filled 2019-11-12: qty 28.35

## 2019-11-12 MED ORDER — MIDAZOLAM HCL 2 MG/2ML IJ SOLN
INTRAMUSCULAR | Status: DC | PRN
  Administered 2019-11-12: 2 mg via INTRAVENOUS

## 2019-11-12 MED ORDER — BUPIVACAINE-EPINEPHRINE (PF) 0.25% -1:200000 IJ SOLN
INTRAMUSCULAR | Status: DC | PRN
  Administered 2019-11-12: 30 mL

## 2019-11-12 MED ORDER — METHOCARBAMOL 500 MG PO TABS: 500.0000 mg | ORAL_TABLET | Freq: Four times a day (QID) | ORAL | 0 refills | Status: AC | PRN

## 2019-11-12 MED ORDER — ONDANSETRON HCL 4 MG/2ML IJ SOLN
INTRAMUSCULAR | Status: DC | PRN
  Administered 2019-11-12: 4 mg via INTRAVENOUS

## 2019-11-12 MED ORDER — BUPIVACAINE HCL (PF) 0.25 % IJ SOLN
INTRAMUSCULAR | Status: AC
  Filled 2019-11-12: qty 30

## 2019-11-12 MED ORDER — FENTANYL CITRATE (PF) 100 MCG/2ML IJ SOLN: 25.0000 ug | INTRAMUSCULAR | Status: DC | PRN

## 2019-11-12 SURGICAL SUPPLY — 48 items
BLADE SURG 15 STRL LF DISP TIS (BLADE) ×1 IMPLANT
BLADE SURG 15 STRL SS (BLADE) ×3
CEMENT KYPHON C01A KIT/MIXER (Cement) ×4 IMPLANT
COVER MAYO STAND STRL (DRAPES) ×3 IMPLANT
COVER SURGICAL LIGHT HANDLE (MISCELLANEOUS) ×3 IMPLANT
COVER WAND RF STERILE (DRAPES) ×3 IMPLANT
CURETTE EXPRESS SZ2 7MM (INSTRUMENTS) ×1 IMPLANT
CURRETTE EXPRESS SZ2 7MM (INSTRUMENTS) ×3
DRAPE C-ARM 42X72 X-RAY (DRAPES) ×3 IMPLANT
DRAPE HALF SHEET 40X57 (DRAPES) IMPLANT
DRAPE INCISE IOBAN 66X45 STRL (DRAPES) ×3 IMPLANT
DRAPE LAPAROTOMY T 102X78X121 (DRAPES) ×3 IMPLANT
DRAPE SURG 17X23 STRL (DRAPES) ×12 IMPLANT
DRAPE WARM FLUID 44X44 (DRAPES) ×3 IMPLANT
DURAPREP 26ML APPLICATOR (WOUND CARE) ×3 IMPLANT
GAUZE 4X4 16PLY RFD (DISPOSABLE) ×3 IMPLANT
GAUZE SPONGE 2X2 8PLY STRL LF (GAUZE/BANDAGES/DRESSINGS) ×1 IMPLANT
GAUZE SPONGE 4X4 12PLY STRL LF (GAUZE/BANDAGES/DRESSINGS) ×2 IMPLANT
GLOVE BIO SURGEON STRL SZ7 (GLOVE) ×3 IMPLANT
GLOVE BIO SURGEON STRL SZ8 (GLOVE) ×3 IMPLANT
GLOVE BIOGEL PI IND STRL 7.0 (GLOVE) ×1 IMPLANT
GLOVE BIOGEL PI IND STRL 8 (GLOVE) ×1 IMPLANT
GLOVE BIOGEL PI INDICATOR 7.0 (GLOVE) ×2
GLOVE BIOGEL PI INDICATOR 8 (GLOVE) ×2
GOWN STRL REUS W/ TWL LRG LVL3 (GOWN DISPOSABLE) ×2 IMPLANT
GOWN STRL REUS W/ TWL XL LVL3 (GOWN DISPOSABLE) ×1 IMPLANT
GOWN STRL REUS W/TWL LRG LVL3 (GOWN DISPOSABLE) ×6
GOWN STRL REUS W/TWL XL LVL3 (GOWN DISPOSABLE) ×3
KIT BASIN OR (CUSTOM PROCEDURE TRAY) ×3 IMPLANT
KIT TURNOVER KIT B (KITS) ×3 IMPLANT
NDL HYPO 25X1 1.5 SAFETY (NEEDLE) ×1 IMPLANT
NEEDLE 22X1 1/2 (OR ONLY) (NEEDLE) IMPLANT
NEEDLE HYPO 25X1 1.5 SAFETY (NEEDLE) ×3 IMPLANT
NEEDLE SPNL 18GX3.5 QUINCKE PK (NEEDLE) ×6 IMPLANT
NS IRRIG 1000ML POUR BTL (IV SOLUTION) ×3 IMPLANT
PACK UNIVERSAL I (CUSTOM PROCEDURE TRAY) ×3 IMPLANT
PAD ARMBOARD 7.5X6 YLW CONV (MISCELLANEOUS) ×6 IMPLANT
POSITIONER HEAD PRONE TRACH (MISCELLANEOUS) ×3 IMPLANT
SPONGE GAUZE 2X2 STER 10/PKG (GAUZE/BANDAGES/DRESSINGS) ×2
SUT MNCRL AB 4-0 PS2 18 (SUTURE) ×3 IMPLANT
SYR BULB IRRIGATION 50ML (SYRINGE) ×3 IMPLANT
SYR CONTROL 10ML LL (SYRINGE) ×3 IMPLANT
TAPE CLOTH 4X10 WHT NS (GAUZE/BANDAGES/DRESSINGS) ×2 IMPLANT
TOWEL GREEN STERILE (TOWEL DISPOSABLE) ×3 IMPLANT
TOWEL GREEN STERILE FF (TOWEL DISPOSABLE) ×3 IMPLANT
TRAY KYPHOPAK 15/2 EXPRESS (KITS) ×4 IMPLANT
TRAY KYPHOPAK 15/3 ONESTEP 1ST (MISCELLANEOUS) IMPLANT
TRAY KYPHOPAK 20/3 ONESTEP 1ST (MISCELLANEOUS) IMPLANT

## 2019-11-12 NOTE — Anesthesia Procedure Notes (Signed)
Procedure Name: Intubation Date/Time: 11/12/2019 12:39 PM Performed by: Kathryne Hitch, CRNA Pre-anesthesia Checklist: Patient identified, Emergency Drugs available, Suction available and Patient being monitored Patient Re-evaluated:Patient Re-evaluated prior to induction Oxygen Delivery Method: Circle system utilized Preoxygenation: Pre-oxygenation with 100% oxygen Induction Type: IV induction Ventilation: Mask ventilation without difficulty Laryngoscope Size: Miller and 2 Grade View: Grade I Tube type: Oral Tube size: 7.0 mm Number of attempts: 1 Airway Equipment and Method: Stylet and Oral airway Placement Confirmation: ETT inserted through vocal cords under direct vision,  positive ETCO2 and breath sounds checked- equal and bilateral Secured at: 21 cm Tube secured with: Tape Dental Injury: Teeth and Oropharynx as per pre-operative assessment

## 2019-11-12 NOTE — Transfer of Care (Signed)
Immediate Anesthesia Transfer of Care Note  Patient: REMONICA HAYCOX  Procedure(s) Performed: THORACIC 8 AND THORACIC 11 KYPHOPLASTY (N/A Spine Thoracic)  Patient Location: PACU  Anesthesia Type:General  Level of Consciousness: drowsy and patient cooperative  Airway & Oxygen Therapy: Patient Spontanous Breathing and Patient connected to face mask oxygen  Post-op Assessment: Report given to RN and Post -op Vital signs reviewed and stable  Post vital signs: Reviewed and stable  Last Vitals:  Vitals Value Taken Time  BP 116/66 11/12/19 1413  Temp    Pulse 77 11/12/19 1414  Resp 17 11/12/19 1414  SpO2 99 % 11/12/19 1414  Vitals shown include unvalidated device data.  Last Pain:  Vitals:   11/12/19 1048  TempSrc:   PainSc: 0-No pain         Complications: No apparent anesthesia complications

## 2019-11-12 NOTE — H&P (Signed)
PREOPERATIVE H&P  Chief Complaint: back pain  HPI: Cheyenne Singleton is a 69 y.o. female who presents with ongoing pain in the back  MRI reveals subacute compression fractures at T8 and T1  Patient has failed multiple forms of conservative care and continues to have pain (see office notes for additional details regarding the patient's full course of treatment)  Past Medical History:  Diagnosis Date  . Anemia   . Arthritis    Past Surgical History:  Procedure Laterality Date  . TUBAL LIGATION     Social History   Socioeconomic History  . Marital status: Legally Separated    Spouse name: Not on file  . Number of children: Not on file  . Years of education: Not on file  . Highest education level: Not on file  Occupational History  . Not on file  Tobacco Use  . Smoking status: Never Smoker  . Smokeless tobacco: Never Used  Substance and Sexual Activity  . Alcohol use: Never  . Drug use: Never  . Sexual activity: Not on file  Other Topics Concern  . Not on file  Social History Narrative  . Not on file   Social Determinants of Health   Financial Resource Strain:   . Difficulty of Paying Living Expenses: Not on file  Food Insecurity:   . Worried About Charity fundraiser in the Last Year: Not on file  . Ran Out of Food in the Last Year: Not on file  Transportation Needs:   . Lack of Transportation (Medical): Not on file  . Lack of Transportation (Non-Medical): Not on file  Physical Activity:   . Days of Exercise per Week: Not on file  . Minutes of Exercise per Session: Not on file  Stress:   . Feeling of Stress : Not on file  Social Connections:   . Frequency of Communication with Friends and Family: Not on file  . Frequency of Social Gatherings with Friends and Family: Not on file  . Attends Religious Services: Not on file  . Active Member of Clubs or Organizations: Not on file  . Attends Archivist Meetings: Not on file  . Marital Status: Not  on file   History reviewed. No pertinent family history. Allergies  Allergen Reactions  . Alendronate     Chest pain   Prior to Admission medications   Medication Sig Start Date End Date Taking? Authorizing Provider  acidophilus (RISAQUAD) CAPS capsule Take by mouth daily.   Yes [provider]  Calcium Carbonate-Vit D-Min (CALCIUM 1200 PO) Take 1,200 mg by mouth daily.   Yes [provider]  Flaxseed, Linseed, (FLAXSEED OIL PO) Take 1,400 mg by mouth daily.   Yes [provider]  magnesium oxide (MAG-OX) 400 MG tablet Take 400 mg by mouth 2 (two) times daily.   Yes [provider]  Multiple Vitamin (MULTIVITAMIN WITH MINERALS) TABS tablet Take 1 tablet by mouth daily.   Yes [provider]  Omega-3 Fatty Acids (FISH OIL) 1000 MG CAPS Take 1,000 mg by mouth daily.   Yes [provider]  PREBIOTIC PRODUCT PO Take 1 tablet by mouth daily.   Yes [provider]  traMADol (ULTRAM) 50 MG tablet Take 50 mg by mouth in the morning and at bedtime. 11/06/19  Yes [provider]  traZODone (DESYREL) 150 MG tablet Take 150 mg by mouth at bedtime. 10/14/19  Yes [provider]  vitamin B-12 (CYANOCOBALAMIN) 500 MCG tablet  Take 500 mcg by mouth daily.   Yes [provider]  vitamin C (ASCORBIC ACID) 250 MG tablet Take 250 mg by mouth daily.   Yes [provider]  Vitamin D-Vitamin K (K2 PLUS D3 PO) Take 1 tablet by mouth daily.   Yes [provider]  vitamin E 180 MG (400 UNITS) capsule Take 400 Units by mouth daily.   Yes [provider]  zinc gluconate 50 MG tablet Take 50 mg by mouth daily.   Yes [provider]     All other systems have been reviewed and were otherwise negative with the exception of those mentioned in the HPI and as above.  Physical Exam: Vitals:   11/12/19 1010  BP: 123/68  Pulse: 74  Resp: 19  Temp: 98.3 F (36.8 C)  SpO2: 97%    Body mass  index is 20.73 kg/m.  General: Alert, no acute distress Cardiovascular: No pedal edema Respiratory: No cyanosis, no use of accessory musculature Skin: No lesions in the area of chief complaint Neurologic: Sensation intact distally Psychiatric: Patient is competent for consent with normal mood and affect Lymphatic: No axillary or cervical lymphadenopathy  MUSCULOSKELETAL: + TTP in the mid-back  Assessment/Plan: THORACIC COMPRESSION FRACTURES Plan for Procedure(s): THORACIC 8 AND THORACIC 11 KYPHOPLASTY   Norva Karvonen, MD 11/12/2019 11:37 AM

## 2019-11-12 NOTE — Anesthesia Postprocedure Evaluation (Signed)
Anesthesia Post Note  Patient: Cheyenne Singleton  Procedure(s) Performed: THORACIC 8 AND THORACIC 11 KYPHOPLASTY (N/A Spine Thoracic)     Patient location during evaluation: PACU Anesthesia Type: General Level of consciousness: awake and alert Pain management: pain level controlled Vital Signs Assessment: post-procedure vital signs reviewed and stable Respiratory status: spontaneous breathing, nonlabored ventilation and respiratory function stable Cardiovascular status: blood pressure returned to baseline and stable Postop Assessment: no apparent nausea or vomiting Anesthetic complications: no    Last Vitals:  Vitals:   11/12/19 1427 11/12/19 1440  BP: 130/70 136/71  Pulse: 70 63  Resp: 16 (!) 21  Temp:    SpO2: 98% 100%    Last Pain:  Vitals:   11/12/19 1415  TempSrc:   PainSc: 3                  Zynia Wojtowicz,W. EDMOND

## 2019-11-12 NOTE — Op Note (Signed)
PATIENT NAME: Cheyenne Singleton RECORD NO.:   [id number]    DATE OF BIRTH: April 19, 1951   DATE OF PROCEDURE: 11/12/2019                              OPERATIVE REPORT   PREOPERATIVE DIAGNOSIS:  T8 and T11 compression fractures  POSTOPERATIVE DIAGNOSIS:  T8 and T11 compression fractures  PROCEDURE:  T8 and T11 kyphoplastys.  SURGEON:  Phylliss Bob, MD.  ASSISTANTPricilla Holm, PA-C.  ANESTHESIA:  General endotracheal anesthesia.  COMPLICATIONS:  None.  DISPOSITION:  Stable.  ESTIMATED BLOOD LOSS:  Minimal.  INDICATIONS FOR SURGERY:  Briefly, [name] is a very pleasant 69- year-old patient, who did have an onset of pain in her mid back.   Her pain was rather severe. The patient's imaging studies did clearly reveal compression fractures as noted above. We did attempt a trial of nonoperative treatment, but the patient continued to feel rather debilitated as a result of her ongoing pain. Given her ongoing pain and dysfunction, we did discuss proceeding with the procedure noted above.  I did fully discuss the procedure with the patient, and she did wish to proceed.  OPERATIVE DETAILS:  On 11/12/2019, the patient was brought to surgery and general endotracheal anesthesia was administered.  The patient was placed prone on a well-padded flat Jackson bed with gel rolls placed under the patient's chest and hips.  Antibiotics were given.  AP and lateral fluoroscopy was brought into the field.  The T8 and T11 pedicles were marked out in the usual fashion.  After a time-out procedure was performed, I did advance Jamshidis across the T8 and T11 pedicles on the right and left sides.  I then drilled through the Jamshidis.  I then inserted kyphoplasty balloons and I was able to inflate the balloons with approximately 4cc of contrast in each vertebral body. At this point, after the cement was mixed, a total of approximately 4cc of cement was injected into each vertebral body, half on  the right, and half on the left sides.  Excellent interdigitation of cement at each levelwas identified. There was no abnormal extravasation of cement noted.  The cement was then allowed to harden, after which point the Jamshidis were removed.  The wound  was then irrigated and closed using 4-0 Monocryl.  Bacitracin and a sterile dressing were applied.  The patient was then awoken from general endotracheal anesthesia and transferred to recovery in stable condition.   Phylliss Bob, MD

## 2019-11-13 ENCOUNTER — Encounter: Payer: Self-pay | Admitting: *Deleted

## 2019-11-25 DIAGNOSIS — Z9889 Other specified postprocedural states: Secondary | ICD-10-CM | POA: Diagnosis not present

## 2019-12-03 DIAGNOSIS — S32000A Wedge compression fracture of unspecified lumbar vertebra, initial encounter for closed fracture: Secondary | ICD-10-CM | POA: Diagnosis not present

## 2019-12-03 DIAGNOSIS — M810 Age-related osteoporosis without current pathological fracture: Secondary | ICD-10-CM | POA: Diagnosis not present

## 2020-01-05 DIAGNOSIS — M546 Pain in thoracic spine: Secondary | ICD-10-CM | POA: Diagnosis not present

## 2020-01-05 DIAGNOSIS — Z9889 Other specified postprocedural states: Secondary | ICD-10-CM | POA: Diagnosis not present

## 2020-01-08 DIAGNOSIS — M546 Pain in thoracic spine: Secondary | ICD-10-CM | POA: Diagnosis not present

## 2020-01-15 DIAGNOSIS — M546 Pain in thoracic spine: Secondary | ICD-10-CM | POA: Diagnosis not present

## 2020-01-25 DIAGNOSIS — M5414 Radiculopathy, thoracic region: Secondary | ICD-10-CM | POA: Diagnosis not present

## 2020-02-09 DIAGNOSIS — M5414 Radiculopathy, thoracic region: Secondary | ICD-10-CM | POA: Diagnosis not present

## 2020-02-15 DIAGNOSIS — M549 Dorsalgia, unspecified: Secondary | ICD-10-CM | POA: Diagnosis not present

## 2020-02-15 DIAGNOSIS — E559 Vitamin D deficiency, unspecified: Secondary | ICD-10-CM | POA: Diagnosis not present

## 2020-02-15 DIAGNOSIS — G8929 Other chronic pain: Secondary | ICD-10-CM | POA: Diagnosis not present

## 2020-02-15 DIAGNOSIS — Z79899 Other long term (current) drug therapy: Secondary | ICD-10-CM | POA: Diagnosis not present

## 2020-03-04 DIAGNOSIS — M5414 Radiculopathy, thoracic region: Secondary | ICD-10-CM | POA: Diagnosis not present

## 2020-03-08 DIAGNOSIS — S2243XA Multiple fractures of ribs, bilateral, initial encounter for closed fracture: Secondary | ICD-10-CM | POA: Diagnosis not present

## 2020-03-08 DIAGNOSIS — R079 Chest pain, unspecified: Secondary | ICD-10-CM | POA: Diagnosis not present

## 2020-03-08 DIAGNOSIS — R222 Localized swelling, mass and lump, trunk: Secondary | ICD-10-CM | POA: Diagnosis not present

## 2020-03-15 DIAGNOSIS — Z8781 Personal history of (healed) traumatic fracture: Secondary | ICD-10-CM | POA: Diagnosis not present

## 2020-03-15 DIAGNOSIS — N951 Menopausal and female climacteric states: Secondary | ICD-10-CM | POA: Diagnosis not present

## 2020-04-05 DIAGNOSIS — R0789 Other chest pain: Secondary | ICD-10-CM | POA: Diagnosis not present

## 2020-04-05 DIAGNOSIS — G8929 Other chronic pain: Secondary | ICD-10-CM | POA: Diagnosis not present

## 2020-04-05 DIAGNOSIS — S2220XA Unspecified fracture of sternum, initial encounter for closed fracture: Secondary | ICD-10-CM | POA: Diagnosis not present

## 2020-04-05 DIAGNOSIS — M545 Low back pain: Secondary | ICD-10-CM | POA: Diagnosis not present

## 2020-04-06 DIAGNOSIS — M810 Age-related osteoporosis without current pathological fracture: Secondary | ICD-10-CM | POA: Diagnosis not present

## 2020-04-06 DIAGNOSIS — K909 Intestinal malabsorption, unspecified: Secondary | ICD-10-CM | POA: Diagnosis not present

## 2020-04-07 DIAGNOSIS — Z20822 Contact with and (suspected) exposure to covid-19: Secondary | ICD-10-CM | POA: Diagnosis not present

## 2020-04-26 DIAGNOSIS — M810 Age-related osteoporosis without current pathological fracture: Secondary | ICD-10-CM | POA: Diagnosis not present

## 2020-05-13 DIAGNOSIS — S2243XG Multiple fractures of ribs, bilateral, subsequent encounter for fracture with delayed healing: Secondary | ICD-10-CM | POA: Diagnosis not present

## 2020-05-13 DIAGNOSIS — M810 Age-related osteoporosis without current pathological fracture: Secondary | ICD-10-CM | POA: Diagnosis not present

## 2020-05-13 DIAGNOSIS — S2220XG Unspecified fracture of sternum, subsequent encounter for fracture with delayed healing: Secondary | ICD-10-CM | POA: Diagnosis not present

## 2020-05-13 DIAGNOSIS — R0789 Other chest pain: Secondary | ICD-10-CM | POA: Diagnosis not present

## 2020-05-13 DIAGNOSIS — S22000G Wedge compression fracture of unspecified thoracic vertebra, subsequent encounter for fracture with delayed healing: Secondary | ICD-10-CM | POA: Diagnosis not present

## 2020-05-13 DIAGNOSIS — R634 Abnormal weight loss: Secondary | ICD-10-CM | POA: Diagnosis not present

## 2020-05-13 DIAGNOSIS — K838 Other specified diseases of biliary tract: Secondary | ICD-10-CM | POA: Diagnosis not present

## 2020-05-18 DIAGNOSIS — K862 Cyst of pancreas: Secondary | ICD-10-CM | POA: Diagnosis not present

## 2020-05-18 DIAGNOSIS — R634 Abnormal weight loss: Secondary | ICD-10-CM | POA: Diagnosis not present

## 2020-05-18 DIAGNOSIS — N289 Disorder of kidney and ureter, unspecified: Secondary | ICD-10-CM | POA: Diagnosis not present

## 2020-05-18 DIAGNOSIS — K838 Other specified diseases of biliary tract: Secondary | ICD-10-CM | POA: Diagnosis not present

## 2020-05-27 DIAGNOSIS — M545 Low back pain: Secondary | ICD-10-CM | POA: Diagnosis not present

## 2020-05-27 DIAGNOSIS — M546 Pain in thoracic spine: Secondary | ICD-10-CM | POA: Diagnosis not present

## 2020-05-30 DIAGNOSIS — N289 Disorder of kidney and ureter, unspecified: Secondary | ICD-10-CM | POA: Diagnosis not present

## 2020-05-30 DIAGNOSIS — R809 Proteinuria, unspecified: Secondary | ICD-10-CM | POA: Diagnosis not present

## 2020-05-30 DIAGNOSIS — R778 Other specified abnormalities of plasma proteins: Secondary | ICD-10-CM | POA: Diagnosis not present

## 2020-05-30 DIAGNOSIS — M810 Age-related osteoporosis without current pathological fracture: Secondary | ICD-10-CM | POA: Diagnosis not present

## 2020-05-30 DIAGNOSIS — D472 Monoclonal gammopathy: Secondary | ICD-10-CM | POA: Diagnosis not present

## 2020-06-01 DIAGNOSIS — R809 Proteinuria, unspecified: Secondary | ICD-10-CM | POA: Diagnosis not present

## 2020-06-01 DIAGNOSIS — N289 Disorder of kidney and ureter, unspecified: Secondary | ICD-10-CM | POA: Diagnosis not present

## 2020-06-01 DIAGNOSIS — R778 Other specified abnormalities of plasma proteins: Secondary | ICD-10-CM | POA: Diagnosis not present

## 2020-06-08 DIAGNOSIS — I700 Atherosclerosis of aorta: Secondary | ICD-10-CM | POA: Diagnosis not present

## 2020-06-08 DIAGNOSIS — M899 Disorder of bone, unspecified: Secondary | ICD-10-CM | POA: Diagnosis not present

## 2020-06-08 DIAGNOSIS — J9811 Atelectasis: Secondary | ICD-10-CM | POA: Diagnosis not present

## 2020-06-08 DIAGNOSIS — D259 Leiomyoma of uterus, unspecified: Secondary | ICD-10-CM | POA: Diagnosis not present

## 2020-06-08 DIAGNOSIS — R7301 Impaired fasting glucose: Secondary | ICD-10-CM | POA: Diagnosis not present

## 2020-06-08 DIAGNOSIS — Z8781 Personal history of (healed) traumatic fracture: Secondary | ICD-10-CM | POA: Diagnosis not present

## 2020-06-08 DIAGNOSIS — D472 Monoclonal gammopathy: Secondary | ICD-10-CM | POA: Diagnosis not present

## 2020-06-08 DIAGNOSIS — M810 Age-related osteoporosis without current pathological fracture: Secondary | ICD-10-CM | POA: Diagnosis not present

## 2020-06-08 DIAGNOSIS — I517 Cardiomegaly: Secondary | ICD-10-CM | POA: Diagnosis not present

## 2020-06-21 DIAGNOSIS — K862 Cyst of pancreas: Secondary | ICD-10-CM | POA: Diagnosis not present

## 2020-06-21 DIAGNOSIS — M5126 Other intervertebral disc displacement, lumbar region: Secondary | ICD-10-CM | POA: Diagnosis not present

## 2020-06-21 DIAGNOSIS — N2889 Other specified disorders of kidney and ureter: Secondary | ICD-10-CM | POA: Diagnosis not present

## 2020-06-21 DIAGNOSIS — S22060A Wedge compression fracture of T7-T8 vertebra, initial encounter for closed fracture: Secondary | ICD-10-CM | POA: Diagnosis not present

## 2020-06-21 DIAGNOSIS — S22059A Unspecified fracture of T5-T6 vertebra, initial encounter for closed fracture: Secondary | ICD-10-CM | POA: Diagnosis not present

## 2020-06-21 DIAGNOSIS — R600 Localized edema: Secondary | ICD-10-CM | POA: Diagnosis not present

## 2020-06-21 DIAGNOSIS — N281 Cyst of kidney, acquired: Secondary | ICD-10-CM | POA: Diagnosis not present

## 2020-06-21 DIAGNOSIS — M546 Pain in thoracic spine: Secondary | ICD-10-CM | POA: Diagnosis not present

## 2020-06-21 DIAGNOSIS — M40204 Unspecified kyphosis, thoracic region: Secondary | ICD-10-CM | POA: Diagnosis not present

## 2020-06-21 DIAGNOSIS — M545 Low back pain, unspecified: Secondary | ICD-10-CM | POA: Diagnosis not present

## 2020-06-21 DIAGNOSIS — K8689 Other specified diseases of pancreas: Secondary | ICD-10-CM | POA: Diagnosis not present

## 2020-06-21 DIAGNOSIS — D734 Cyst of spleen: Secondary | ICD-10-CM | POA: Diagnosis not present

## 2020-06-23 DIAGNOSIS — R634 Abnormal weight loss: Secondary | ICD-10-CM | POA: Diagnosis not present

## 2020-06-23 DIAGNOSIS — S2243XG Multiple fractures of ribs, bilateral, subsequent encounter for fracture with delayed healing: Secondary | ICD-10-CM | POA: Diagnosis not present

## 2020-06-23 DIAGNOSIS — Z5181 Encounter for therapeutic drug level monitoring: Secondary | ICD-10-CM | POA: Diagnosis not present

## 2020-06-23 DIAGNOSIS — S2220XG Unspecified fracture of sternum, subsequent encounter for fracture with delayed healing: Secondary | ICD-10-CM | POA: Diagnosis not present

## 2020-06-23 DIAGNOSIS — R0789 Other chest pain: Secondary | ICD-10-CM | POA: Diagnosis not present

## 2020-06-23 DIAGNOSIS — S22000G Wedge compression fracture of unspecified thoracic vertebra, subsequent encounter for fracture with delayed healing: Secondary | ICD-10-CM | POA: Diagnosis not present

## 2020-06-28 DIAGNOSIS — C9000 Multiple myeloma not having achieved remission: Secondary | ICD-10-CM | POA: Diagnosis not present

## 2020-06-28 DIAGNOSIS — D472 Monoclonal gammopathy: Secondary | ICD-10-CM | POA: Diagnosis not present

## 2020-07-01 DIAGNOSIS — M546 Pain in thoracic spine: Secondary | ICD-10-CM | POA: Diagnosis not present

## 2020-07-05 DIAGNOSIS — M810 Age-related osteoporosis without current pathological fracture: Secondary | ICD-10-CM | POA: Diagnosis not present

## 2020-07-05 DIAGNOSIS — R778 Other specified abnormalities of plasma proteins: Secondary | ICD-10-CM | POA: Diagnosis not present

## 2020-07-05 DIAGNOSIS — C9000 Multiple myeloma not having achieved remission: Secondary | ICD-10-CM | POA: Diagnosis not present

## 2020-07-05 DIAGNOSIS — Z87310 Personal history of (healed) osteoporosis fracture: Secondary | ICD-10-CM | POA: Diagnosis not present

## 2020-07-05 DIAGNOSIS — M899 Disorder of bone, unspecified: Secondary | ICD-10-CM | POA: Diagnosis not present

## 2020-07-07 DIAGNOSIS — G893 Neoplasm related pain (acute) (chronic): Secondary | ICD-10-CM | POA: Diagnosis not present

## 2020-07-07 DIAGNOSIS — S2243XG Multiple fractures of ribs, bilateral, subsequent encounter for fracture with delayed healing: Secondary | ICD-10-CM | POA: Diagnosis not present

## 2020-07-07 DIAGNOSIS — S22000G Wedge compression fracture of unspecified thoracic vertebra, subsequent encounter for fracture with delayed healing: Secondary | ICD-10-CM | POA: Diagnosis not present

## 2020-07-07 DIAGNOSIS — C9000 Multiple myeloma not having achieved remission: Secondary | ICD-10-CM | POA: Diagnosis not present

## 2020-07-07 DIAGNOSIS — S2220XG Unspecified fracture of sternum, subsequent encounter for fracture with delayed healing: Secondary | ICD-10-CM | POA: Diagnosis not present

## 2020-09-17 DEATH — deceased

## 2021-03-04 IMAGING — RF DG THORACIC SPINE 2V
1 series · 1 of 1 positions shown · IV contrast (agent unspecified)
Comparison: MRI 03/01/2019

CLINICAL DATA: Kyphoplasty.

EXAM:
DG C-ARM 1-60 MIN; THORACIC SPINE 2 VIEWS
CONTRAST:  None.
FLUOROSCOPY TIME:  Fluoroscopy Time:  3 minutes 8 3 seconds
Number of Acquired Spot Images: 1.

[Series 1: run · 1 of 1 slices shown]
[im 1/1]
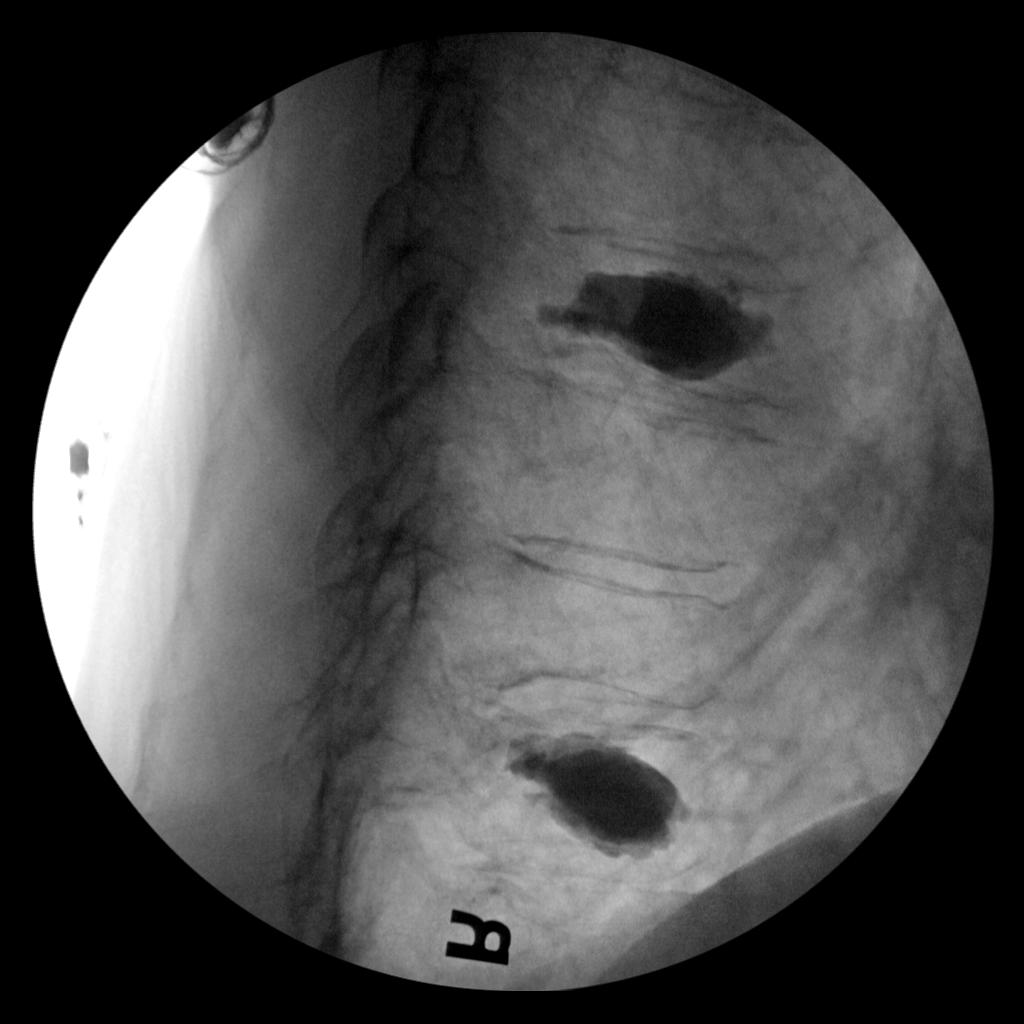

[1 of 1 positions shown; findings below may reference images not displayed]

FINDINGS: Thoracic spine kyphoplasties noted. Diffuse osteopenia. Diffuse
degenerative change. Thoracic spine compression fractures again
noted.
IMPRESSION: Thoracic spine kyphoplasties.

## 2021-03-04 IMAGING — RF DG C-ARM 1-60 MIN
2 series · 2 of 2 positions shown · IV contrast (agent unspecified)
Comparison: MRI 03/01/2019

CLINICAL DATA: Kyphoplasty.

EXAM:
DG C-ARM 1-60 MIN; THORACIC SPINE 2 VIEWS
CONTRAST:  None.
FLUOROSCOPY TIME:  Fluoroscopy Time:  3 minutes 8 3 seconds
Number of Acquired Spot Images: 1.

[Series 1: run · 1 of 1 slices shown (1 of 2)]
[im 1/1]
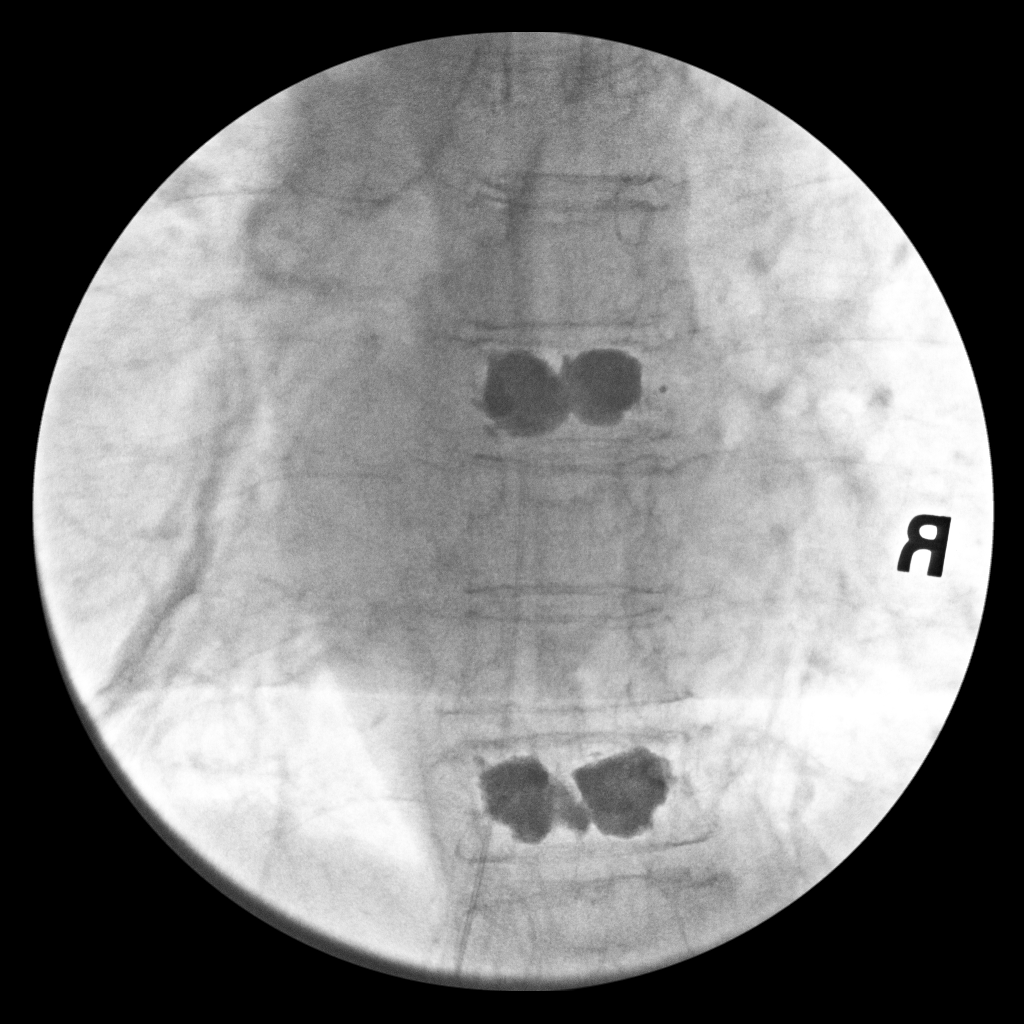

[Series 1: run · 1 of 1 slices shown (2 of 2)]
[im 1/1]
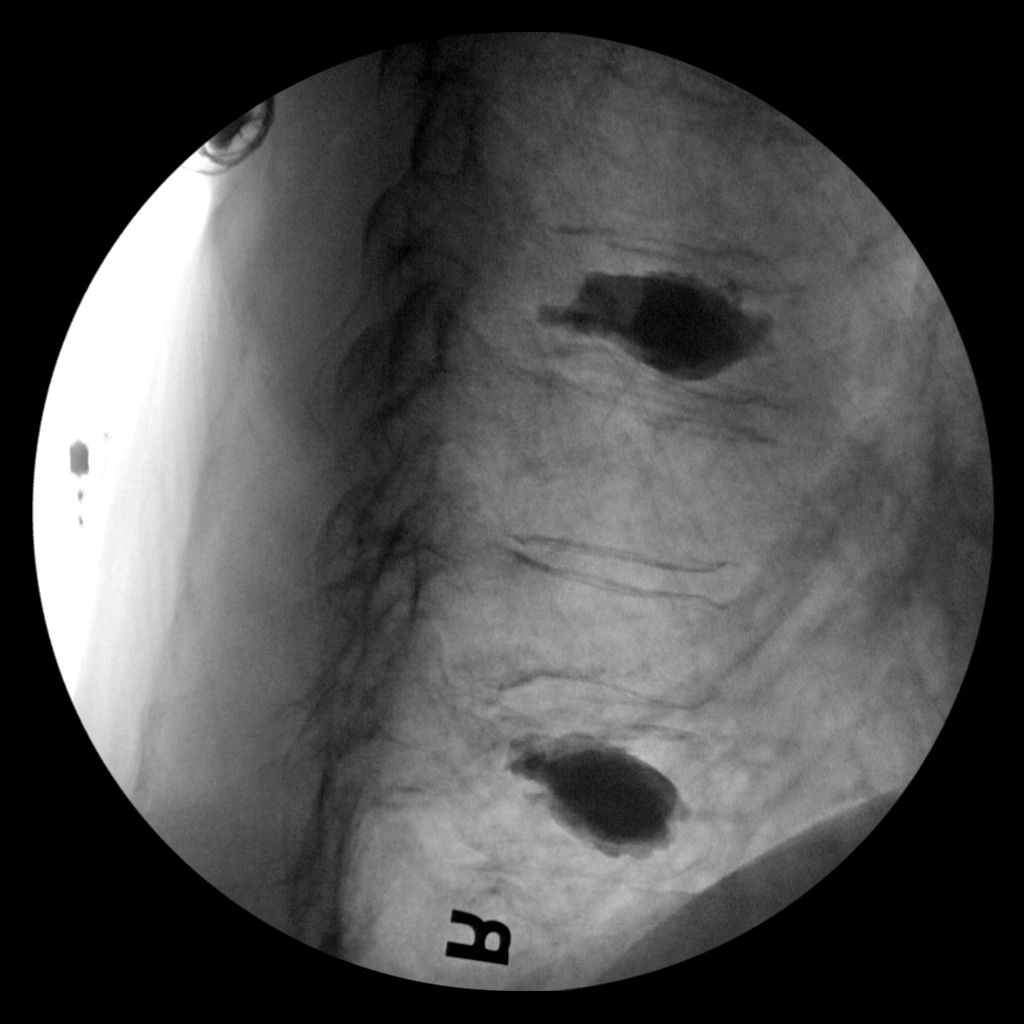

[2 of 2 positions shown; findings below may reference images not displayed]

FINDINGS: Thoracic spine kyphoplasties noted. Diffuse osteopenia. Diffuse
degenerative change. Thoracic spine compression fractures again
noted.
IMPRESSION: Thoracic spine kyphoplasties.
